# Patient Record
Sex: Male | Born: 1970 | Race: Black or African American | Hispanic: No | Marital: Single | Smoking: Current every day smoker
Health system: Southern US, Community
[De-identification: ages and names within clinical notes are randomized; demographics above are authoritative.]

## PROBLEM LIST (undated history)

## (undated) DIAGNOSIS — B20 Human immunodeficiency virus [HIV] disease: Secondary | ICD-10-CM

## (undated) DIAGNOSIS — Z21 Asymptomatic human immunodeficiency virus [HIV] infection status: Secondary | ICD-10-CM

## (undated) DIAGNOSIS — B1910 Unspecified viral hepatitis B without hepatic coma: Secondary | ICD-10-CM

---

## 2016-08-27 ENCOUNTER — Emergency Department (HOSPITAL_BASED_OUTPATIENT_CLINIC_OR_DEPARTMENT_OTHER)
Admission: EM | Admit: 2016-08-27 | Discharge: 2016-08-28 | Disposition: A | Discharge status: Home / Self Care | Payer: Medicare (Managed Care) | Attending: Emergency Medicine | Admitting: Emergency Medicine

## 2016-08-27 ENCOUNTER — Encounter (HOSPITAL_BASED_OUTPATIENT_CLINIC_OR_DEPARTMENT_OTHER): Payer: Self-pay | Admitting: *Deleted

## 2016-08-27 ENCOUNTER — Emergency Department (HOSPITAL_BASED_OUTPATIENT_CLINIC_OR_DEPARTMENT_OTHER): Payer: Medicare (Managed Care)

## 2016-08-27 DIAGNOSIS — F17210 Nicotine dependence, cigarettes, uncomplicated: Secondary | ICD-10-CM | POA: Insufficient documentation

## 2016-08-27 DIAGNOSIS — M791 Myalgia: Secondary | ICD-10-CM | POA: Insufficient documentation

## 2016-08-27 DIAGNOSIS — R609 Edema, unspecified: Secondary | ICD-10-CM | POA: Diagnosis not present

## 2016-08-27 DIAGNOSIS — Z21 Asymptomatic human immunodeficiency virus [HIV] infection status: Secondary | ICD-10-CM | POA: Diagnosis not present

## 2016-08-27 DIAGNOSIS — M7989 Other specified soft tissue disorders: Secondary | ICD-10-CM | POA: Diagnosis present

## 2016-08-27 HISTORY — DX: Asymptomatic human immunodeficiency virus (hiv) infection status: Z21

## 2016-08-27 HISTORY — DX: Human immunodeficiency virus (HIV) disease: B20

## 2016-08-27 HISTORY — DX: Unspecified viral hepatitis B without hepatic coma: B19.10

## 2016-08-27 LAB — CBC WITH DIFFERENTIAL/PLATELET
BASOS ABS: 0 10*3/uL (ref 0.0–0.1)
Basophils Relative: 0 %
EOS ABS: 0.1 10*3/uL (ref 0.0–0.7)
EOS PCT: 2 %
HCT: 35.1 % — ABNORMAL LOW (ref 39.0–52.0)
Hemoglobin: 11.6 g/dL — ABNORMAL LOW (ref 13.0–17.0)
LYMPHS PCT: 44 %
Lymphs Abs: 1.9 10*3/uL (ref 0.7–4.0)
MCH: 26.7 pg (ref 26.0–34.0)
MCHC: 33 g/dL (ref 30.0–36.0)
MCV: 80.9 fL (ref 78.0–100.0)
MONO ABS: 0.4 10*3/uL (ref 0.1–1.0)
Monocytes Relative: 9 %
Neutro Abs: 1.9 10*3/uL (ref 1.7–7.7)
Neutrophils Relative %: 45 %
PLATELETS: 122 10*3/uL — AB (ref 150–400)
RBC: 4.34 MIL/uL (ref 4.22–5.81)
RDW: 15 % (ref 11.5–15.5)
WBC: 4.3 10*3/uL (ref 4.0–10.5)

## 2016-08-27 LAB — BASIC METABOLIC PANEL
Anion gap: 6 (ref 5–15)
BUN: 7 mg/dL (ref 6–20)
CO2: 22 mmol/L (ref 22–32)
CREATININE: 0.91 mg/dL (ref 0.61–1.24)
Calcium: 8.6 mg/dL — ABNORMAL LOW (ref 8.9–10.3)
Chloride: 107 mmol/L (ref 101–111)
GFR calc Af Amer: >60 mL/min (ref >60)
GLUCOSE: 91 mg/dL (ref 65–99)
Potassium: 3.9 mmol/L (ref 3.5–5.1)
SODIUM: 135 mmol/L (ref 135–145)

## 2016-08-27 LAB — BRAIN NATRIURETIC PEPTIDE: B Natriuretic Peptide: 56.9 pg/mL (ref 0.0–100.0)

## 2016-08-27 NOTE — ED Notes (Signed)
Patient transported to X-ray 

## 2016-08-27 NOTE — ED Provider Notes (Signed)
[facility]   CSN: [id number] Arrival date & time: 08/27/16  [id number]  By signing my name below, I, Jordan DarnerRussell Crosby, attest that this documentation has been prepared under the direction and in the presence of Jordan RobinsonsJessica Lorrane Mccay, PA-C. Electronically Signed: Linna Darnerussell Crosby, Scribe. 08/27/2016. 9:14 PM.  History   Chief Complaint Chief Complaint  Patient presents with  . Leg Swelling    The history is provided by the patient. No language interpreter was used.     HPI Comments: Jordan ManeKelly Crosby is a 46 y.o. male with PMHx of HIV and hepatitis B who presents to the Emergency Department complaining of constant bilateral lower leg swelling for two days. He reports associated mild pain which he describes as a "tightness" as well as mild fatigue. Pt notes he is on his feet often. No h/o the same. No recent falls or injury to his legs. No recent surgeries or immobilizations. No h/o blood clot. No h/o DM or HTN. Pt receives antiviral therapy for HIV. Pt reports his last medical check-up was a year and a half ago and was normal. He states he smokes ~ 4-5 cigarettes per day. He denies CP, SOB, dizziness, palpitations, fever, abdominal pain, nausea, vomiting, cough, or any other associated symptoms.  Past Medical History:  Diagnosis Date  . Hepatitis B   . HIV (human immunodeficiency virus infection) (HCC)     There are no active problems to display for this patient.   History reviewed. No pertinent surgical history.     Home Medications    Prior to Admission medications   Not on File    Family History History reviewed. No pertinent family history.  Social History Social History  Substance Use Topics  . Smoking status: Current Every Day Smoker    Packs/day: 0.50    Types: Cigarettes  . Smokeless tobacco: Not on file  . Alcohol use No     Allergies   Patient has no known allergies.   Review of Systems Review of Systems  Constitutional: Positive for  fatigue. Negative for fever.  Respiratory: Negative for cough and shortness of breath.   Cardiovascular: Positive for leg swelling. Negative for chest pain and palpitations.  Gastrointestinal: Negative for abdominal pain, nausea and vomiting.  Musculoskeletal: Positive for myalgias.  Neurological: Negative for dizziness.     Physical Exam Updated Vital Signs BP 145/98 (BP Location: Right Arm)   Pulse (!) 50   Temp 98.3 F (36.8 C)   Resp 20   Ht 6\' 1"  (1.854 m)   Wt 74.8 kg   SpO2 100%   BMI 21.77 kg/m   Physical Exam  Constitutional: He is oriented to person, place, and time. He appears well-developed and well-nourished. No distress.  Patient is afebrile, non-toxic appearing, sitting comfortably in chair in no acute distress.  HENT:  Head: Normocephalic and atraumatic.  Eyes: Conjunctivae and EOM are normal.  Neck: Neck supple. No tracheal deviation present.  Cardiovascular: Normal rate, regular rhythm, normal heart sounds and intact distal pulses.   Pulmonary/Chest: Effort normal and breath sounds normal. No respiratory distress. He has no wheezes. He has no rales. He exhibits no tenderness.  Musculoskeletal: Normal range of motion. He exhibits edema. He exhibits no tenderness or deformity.  < 1+ pitting ankle edema bilaterally. Calf supple and non-tender bilaterally. No unilateral swelling Clubbing of fingernails bilaterally.  Neurological: He is alert and oriented to person, place, and time. No sensory deficit. He exhibits normal muscle tone. Coordination normal.  Skin:  Skin is warm and dry. No rash noted. He is not diaphoretic. No erythema. No pallor.  Psychiatric: He has a normal mood and affect. His behavior is normal.  Nursing note and vitals reviewed.    ED Treatments / Results  Labs (all labs ordered are listed, but only abnormal results are displayed) Labs Reviewed  BASIC METABOLIC PANEL - Abnormal; Notable for the following:       Result Value   Calcium 8.6  (*)    All other components within normal limits  CBC WITH DIFFERENTIAL/PLATELET - Abnormal; Notable for the following:    Hemoglobin 11.6 (*)    HCT 35.1 (*)    Platelets 122 (*)    All other components within normal limits  BRAIN NATRIURETIC PEPTIDE    EKG  EKG Interpretation  Date/Time:  Wednesday August 27 2016 21:37:24 EST Ventricular Rate:  51 PR Interval:    QRS Duration: 80 QT Interval:  393 QTC Calculation: 362 R Axis:   40 Text Interpretation:  Sinus rhythm No previous ECGs available Confirmed by LITTLE MD, RACHEL [phone]) on 08/27/2016 11:53:06 PM       Radiology Dg Chest 2 View  Result Date: 08/27/2016 CLINICAL DATA:  Bilateral lower leg swelling and fatigue EXAM: CHEST  2 VIEW COMPARISON:  None. FINDINGS: The heart size and mediastinal contours are within normal limits. Both lungs are clear. Degenerative changes of the spine. IMPRESSION: No active cardiopulmonary disease. Electronically Signed   By: Jasmine Pang M.D.   On: 08/27/2016 22:09    Procedures Procedures (including critical care time)  DIAGNOSTIC STUDIES: Oxygen Saturation is 100% on RA, normal by my interpretation.    COORDINATION OF CARE: 9:22 PM Discussed treatment plan with pt at bedside and pt agreed to plan.  Medications Ordered in ED Medications - No data to display   Initial Impression / Assessment and Plan / ED Course  I have reviewed the triage vital signs and the nursing notes.  Pertinent labs & imaging results that were available during my care of the patient were reviewed by me and considered in my medical decision making (see chart for details).     Patient presents with mild lower extremity bilateral edema. He denies any other symptoms.  Workup unremarkable. Normal creatinine, BNP and CXR. Reviewed EKG with Dr. Clarene Duke.  Patient is well-appearing and in no acute distress. He explains that he has a PCP at [facility] and will be going to an appointment in the next couple  weeks.  Discharge home with instructions to use compression stockings and monitor for any worsening of swelling and/or new associated symptoms of SOB, C/P, or other concerning symptoms.  Discussed strict return precautions. Patient was advised to return to the emergency department if experiencing any new or worsening symptoms. Patient clearly understood instructions and agreed with discharge plan.  Patient was discussed with Dr. Clarene Duke who agrees with assessment and plan. Final Clinical Impressions(s) / ED Diagnoses   Final diagnoses:  Peripheral edema    New Prescriptions There are no discharge medications for this patient.  I personally performed the services described in this documentation, which was scribed in my presence. The recorded information has been reviewed and is accurate.    Georgiana Shore, PA-C 08/28/16 [id number]    Laurence Spates, MD 08/30/16 [phone]

## 2016-08-27 NOTE — ED Notes (Signed)
Pt upset about length of time in department. Pt made aware his results were back and he was awaiting provider to discuss his results. Apologized for wait. Pt states he will wait a few more minutes. PA made aware.

## 2016-08-27 NOTE — ED Triage Notes (Signed)
Pt c/o bil leg swelling and tightness x 3 days, denies SOB

## 2016-08-28 NOTE — Discharge Instructions (Addendum)
Please ensure that you follow-up with your [facility] to have your routine labs drawn. Return to the emergency department if you experience chest pain, shortness of breath or any other concerning symptoms in the meantime.

## 2018-09-18 IMAGING — CR DG CHEST 2V
2 series · 2 of 2 positions shown · non-contrast
Comparison: None.

CLINICAL DATA: Bilateral lower leg swelling and fatigue

EXAM:
CHEST  2 VIEW

[w chest pa]
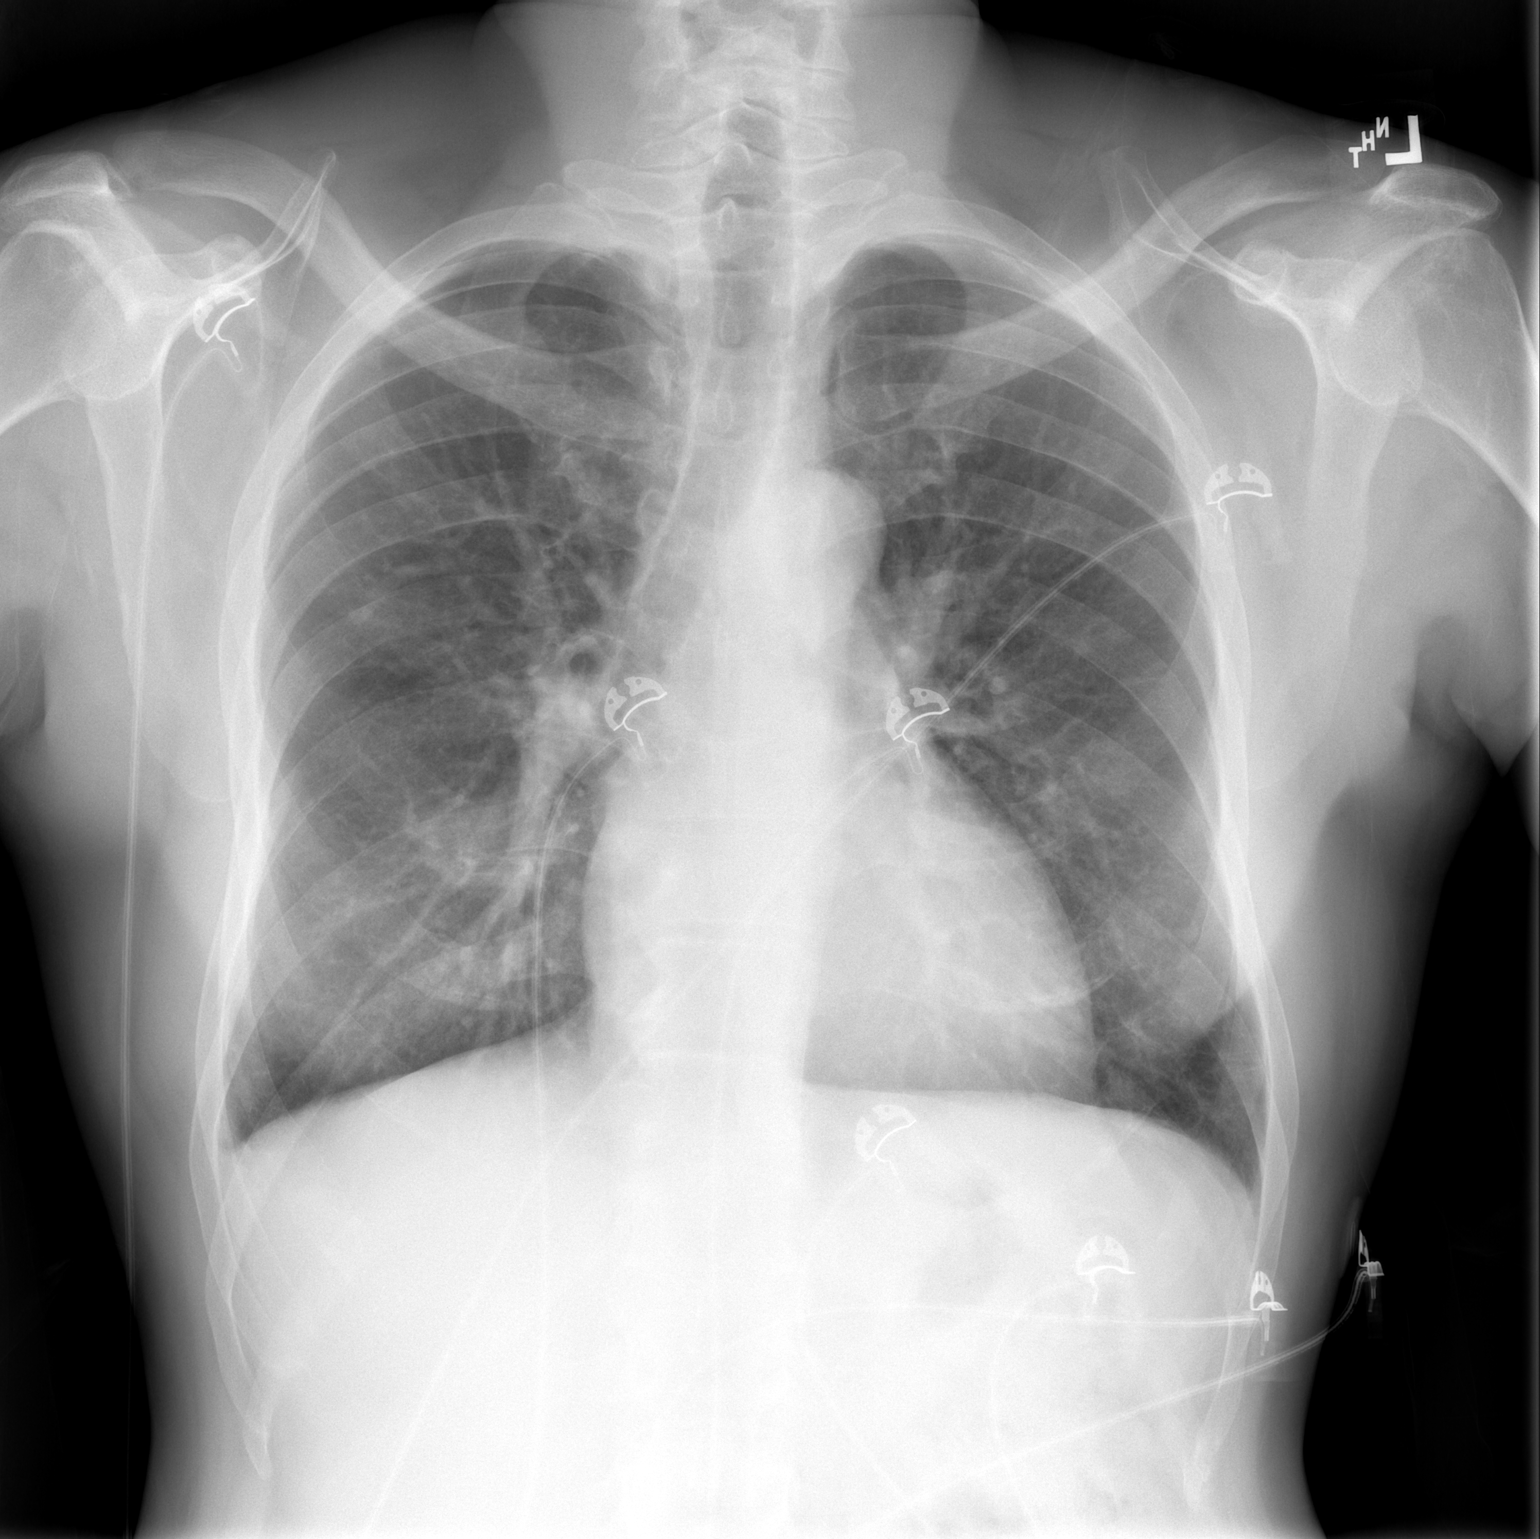

[w chest lat]
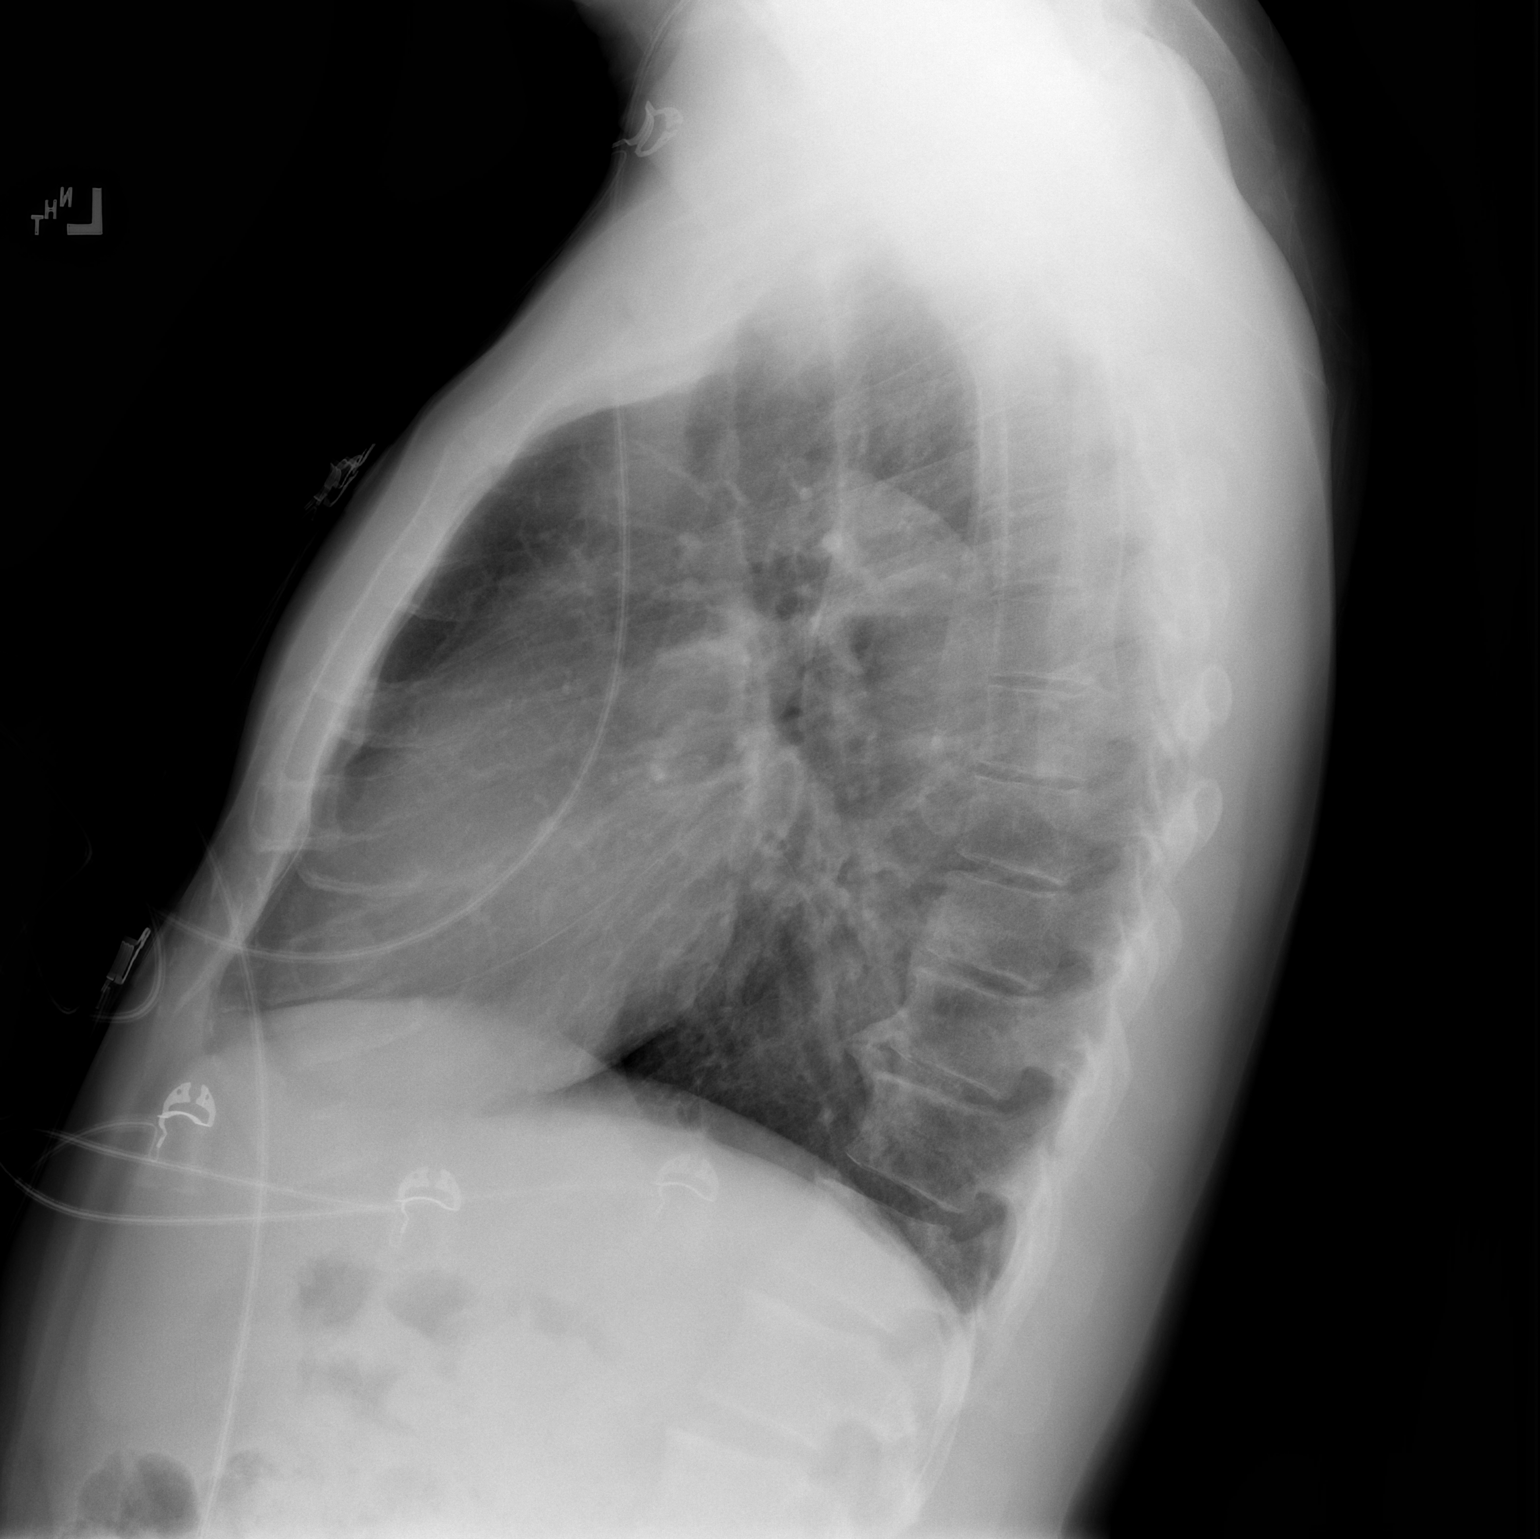

[2 of 2 positions shown; findings below may reference images not displayed]

FINDINGS: The heart size and mediastinal contours are within normal limits.
Both lungs are clear. Degenerative changes of the spine.
IMPRESSION: No active cardiopulmonary disease.
